# Patient Record
Sex: Male | Born: 1945 | Race: Asian | Hispanic: No | Smoking: Former smoker
Health system: Southern US, Community
[De-identification: ages and names within clinical notes are randomized; demographics above are authoritative.]

## PROBLEM LIST (undated history)

## (undated) DIAGNOSIS — I10 Essential (primary) hypertension: Secondary | ICD-10-CM

---

## 2009-11-26 ENCOUNTER — Encounter (INDEPENDENT_AMBULATORY_CARE_PROVIDER_SITE_OTHER): Payer: Self-pay | Admitting: Internal Medicine

## 2009-11-26 ENCOUNTER — Ambulatory Visit: Payer: Self-pay | Admitting: Family Medicine

## 2009-11-26 LAB — CONVERTED CEMR LAB
Alkaline Phosphatase: 87 units/L (ref 39–117)
Basophils Absolute: 0 10*3/uL (ref 0.0–0.1)
Creatinine, Ser: 0.79 mg/dL (ref 0.40–1.50)
Eosinophils Absolute: 0.1 10*3/uL (ref 0.0–0.7)
Eosinophils Relative: 2 % (ref 0–5)
Glucose, Bld: 98 mg/dL (ref 70–99)
Hemoglobin: 16.1 g/dL (ref 13.0–17.0)
Lymphs Abs: 2 10*3/uL (ref 0.7–4.0)
MCV: 88.2 fL (ref 78.0–100.0)
Monocytes Absolute: 0.3 10*3/uL (ref 0.1–1.0)
Platelets: 220 10*3/uL (ref 150–400)
RBC: 5.35 M/uL (ref 4.22–5.81)
RDW: 13.8 % (ref 11.5–15.5)
Sodium: 134 meq/L — ABNORMAL LOW (ref 135–145)
TSH: 1.469 microintl units/mL (ref 0.350–4.500)
Total Bilirubin: 0.7 mg/dL (ref 0.3–1.2)
Total Protein: 8.3 g/dL (ref 6.0–8.3)
WBC: 4.9 10*3/uL (ref 4.0–10.5)

## 2009-11-27 ENCOUNTER — Encounter (INDEPENDENT_AMBULATORY_CARE_PROVIDER_SITE_OTHER): Payer: Self-pay | Admitting: Internal Medicine

## 2009-11-27 LAB — CONVERTED CEMR LAB: Microalb, Ur: 1.81 mg/dL (ref 0.00–1.89)

## 2009-12-02 ENCOUNTER — Ambulatory Visit: Payer: Self-pay | Admitting: Internal Medicine

## 2011-10-16 ENCOUNTER — Emergency Department (HOSPITAL_COMMUNITY)
Admission: EM | Admit: 2011-10-16 | Discharge: 2011-10-17 | Disposition: A | Discharge status: Home / Self Care | Payer: Medicare Other | Attending: Emergency Medicine | Admitting: Emergency Medicine

## 2011-10-16 ENCOUNTER — Emergency Department (HOSPITAL_COMMUNITY): Payer: Medicare Other

## 2011-10-16 ENCOUNTER — Encounter (HOSPITAL_COMMUNITY): Payer: Self-pay | Admitting: Nurse Practitioner

## 2011-10-16 DIAGNOSIS — J189 Pneumonia, unspecified organism: Secondary | ICD-10-CM | POA: Insufficient documentation

## 2011-10-16 DIAGNOSIS — I10 Essential (primary) hypertension: Secondary | ICD-10-CM | POA: Insufficient documentation

## 2011-10-16 DIAGNOSIS — E119 Type 2 diabetes mellitus without complications: Secondary | ICD-10-CM | POA: Insufficient documentation

## 2011-10-16 HISTORY — DX: Essential (primary) hypertension: I10

## 2011-10-16 LAB — CBC
MCV: 85.3 fL (ref 78.0–100.0)
Platelets: 183 10*3/uL (ref 150–400)
RBC: 4.82 MIL/uL (ref 4.22–5.81)
RDW: 13 % (ref 11.5–15.5)
WBC: 5.9 10*3/uL (ref 4.0–10.5)

## 2011-10-16 LAB — BASIC METABOLIC PANEL
CO2: 24 mEq/L (ref 19–32)
Chloride: 95 mEq/L — ABNORMAL LOW (ref 96–112)
GFR calc Af Amer: >90 mL/min (ref >90)
Potassium: 4.1 mEq/L (ref 3.5–5.1)
Sodium: 132 mEq/L — ABNORMAL LOW (ref 135–145)

## 2011-10-16 LAB — POCT I-STAT TROPONIN I

## 2011-10-16 MED ORDER — AZITHROMYCIN 250 MG PO TABS: 250.0000 mg | ORAL_TABLET | Freq: Every day | ORAL | Status: AC

## 2011-10-16 MED ORDER — AZITHROMYCIN 250 MG PO TABS
ORAL_TABLET | ORAL | Status: AC
  Filled 2011-10-16: qty 2

## 2011-10-16 MED ORDER — AZITHROMYCIN 250 MG PO TABS
500.0000 mg | ORAL_TABLET | Freq: Once | ORAL | Status: AC
  Administered 2011-10-16: 500 mg via ORAL

## 2011-10-16 MED ORDER — HYDROCOD POLST-CHLORPHEN POLST 10-8 MG/5ML PO LQCR: 5.0000 mL | Freq: Two times a day (BID) | ORAL | Status: DC | PRN

## 2011-10-16 MED ORDER — HYDROCOD POLST-CHLORPHEN POLST 10-8 MG/5ML PO LQCR
5.0000 mL | Freq: Once | ORAL | Status: AC
  Administered 2011-10-16: 5 mL via ORAL

## 2011-10-16 MED ORDER — HYDROCOD POLST-CHLORPHEN POLST 10-8 MG/5ML PO LQCR
ORAL | Status: AC
  Filled 2011-10-16: qty 10

## 2011-10-16 NOTE — ED Provider Notes (Signed)
History     CSN: [id number]  Arrival date & time 10/16/11  1851   First MD Initiated Contact with Patient 10/16/11 2305      Chief Complaint  Patient presents with  . Shortness of Breath    (Consider location/radiation/quality/duration/timing/severity/associated sxs/prior treatment) HPI 66 year old male presents to the emergency apartment complaining of shortness of breath, and a sense that his heart is beating slow. Family member is translating as patient does not speak [place]. Patient has had cough for 2 weeks. Today at 4:00 he began to have a sensation that his heart was beating slow, and pressure as a band across and around his chest. No fevers no nausea no vomiting no diaphoresis. No known sick contacts. Patient is eating and drinking well. Patient denies chest pain. Patient has history as diabetic with hypertension. Sees [facility]. No lightheadedness, syncope. No rashes Past Medical History  Diagnosis Date  . Diabetes mellitus   . Hypertension     History reviewed. No pertinent past surgical history.  History reviewed. No pertinent family history.  History  Substance Use Topics  . Smoking status: Former Smoker    Quit date: 10/15/1997  . Smokeless tobacco: Not on file  . Alcohol Use: No      Review of Systems  Unable to perform ROS: Other   patient does not speak English  Allergies  Review of patient's allergies indicates no known allergies.  Home Medications   Current Outpatient Rx  Name Route Sig Dispense Refill  . GUAIFENESIN ER 600 MG PO TB12 Oral Take 600 mg by mouth 2 (two) times daily.    Marland Kitchen METFORMIN HCL 1000 MG PO TABS Oral Take 1,000 mg by mouth 2 (two) times daily with a meal.    . OMEPRAZOLE 20 MG PO CPDR Oral Take 20 mg by mouth daily.      BP 148/86  Pulse 66  Temp 97.6 F (36.4 C) (Oral)  Resp 16  Ht 5\' 8"  (1.727 m)  Wt 147 lb (66.679 kg)  BMI 22.35 kg/m2  SpO2 99%  Physical Exam  Nursing note and vitals reviewed. Constitutional: He  is oriented to person, place, and time. He appears well-developed and well-nourished.  HENT:  Head: Normocephalic and atraumatic.  Nose: Nose normal.  Mouth/Throat: Oropharynx is clear and moist.  Eyes: Conjunctivae and EOM are normal. Pupils are equal, round, and reactive to light.  Neck: Normal range of motion. Neck supple. No JVD present. No tracheal deviation present. No thyromegaly present.  Cardiovascular: Normal rate, regular rhythm, normal heart sounds and intact distal pulses.  Exam reveals no gallop and no friction rub.   No murmur heard. Pulmonary/Chest: Effort normal and breath sounds normal. No stridor. No respiratory distress. He has no wheezes. He has no rales. He exhibits no tenderness.  Abdominal: Soft. Bowel sounds are normal. He exhibits no distension and no mass. There is no tenderness. There is no rebound and no guarding.  Musculoskeletal: Normal range of motion. He exhibits no edema and no tenderness.  Lymphadenopathy:    He has no cervical adenopathy.  Neurological: He is alert and oriented to person, place, and time. He exhibits normal muscle tone. Coordination normal.  Skin: Skin is dry. No rash noted. No erythema. No pallor.    ED Course  Procedures (including critical care time)  Labs Reviewed  BASIC METABOLIC PANEL - Abnormal; Notable for the following:    Sodium 132 (*)     Chloride 95 (*)     Glucose, Bld  112 (*)     All other components within normal limits  CBC  POCT I-STAT TROPONIN I   Dg Chest 2 View  10/16/2011  *RADIOLOGY REPORT*  Clinical Data: Short of breath.  Heart is beating flow.  Sensation of lung blockage.  Cough for 2 weeks.  CHEST - 2 VIEW  Comparison: None.  Findings: Patchy density is present in the lingula at the cardiac apex.  This is present on the lateral view as well, suspicious for small focus of pneumonia.  The right lung appears clear. Cardiopericardial silhouette appears within normal limits. There is no effusion.  No plain film  evidence of adenopathy.  IMPRESSION: Faint patchy density along the cardiac apex in the lingula suspicious for pneumonia.  Original Report Authenticated By: Andreas Newport, M.D.    Date: 10/16/2011  Rate:76  Rhythm: normal sinus rhythm  QRS Axis: normal  Intervals: normal  ST/T Wave abnormalities: normal  Conduction Disutrbances:none  Narrative Interpretation:   Old EKG Reviewed: none available   1. Community acquired pneumonia       MDM  66 year old male with shortness of breath and cough for 2 weeks and a sensation of bradycardia. No bradycardia noted here in the emergency department. Patient's chest x-ray shows left lingular pneumonia. Will treat for same and have patient followup with his doctor.        Olivia Mackie, MD 10/16/11 [phone]

## 2011-10-16 NOTE — ED Notes (Signed)
Pt reports feeling SOB and "like his heart is beating slow" since this afternoon. States "it feels like something is blocking his lungs." pt denies any cardiac history. Pt speaks vietnamese. Pt is A&Ox4, resp e/u

## 2011-10-17 NOTE — ED Notes (Signed)
The patient is AOx4 and comfortable with his discharge instructions.  His ride home is present. 

## 2012-10-11 ENCOUNTER — Other Ambulatory Visit: Payer: Self-pay | Admitting: *Deleted

## 2012-11-21 ENCOUNTER — Other Ambulatory Visit: Payer: Self-pay | Admitting: *Deleted

## 2013-10-11 ENCOUNTER — Emergency Department (HOSPITAL_COMMUNITY): Payer: Medicare Other

## 2013-10-11 ENCOUNTER — Emergency Department (HOSPITAL_COMMUNITY)
Admission: EM | Admit: 2013-10-11 | Discharge: 2013-10-12 | Disposition: A | Discharge status: Home / Self Care | Payer: Medicare Other | Attending: Emergency Medicine | Admitting: Emergency Medicine

## 2013-10-11 ENCOUNTER — Encounter (HOSPITAL_COMMUNITY): Payer: Self-pay | Admitting: Emergency Medicine

## 2013-10-11 DIAGNOSIS — R062 Wheezing: Secondary | ICD-10-CM | POA: Insufficient documentation

## 2013-10-11 DIAGNOSIS — Z79899 Other long term (current) drug therapy: Secondary | ICD-10-CM | POA: Insufficient documentation

## 2013-10-11 DIAGNOSIS — I10 Essential (primary) hypertension: Secondary | ICD-10-CM | POA: Insufficient documentation

## 2013-10-11 DIAGNOSIS — E119 Type 2 diabetes mellitus without complications: Secondary | ICD-10-CM | POA: Insufficient documentation

## 2013-10-11 DIAGNOSIS — Z87891 Personal history of nicotine dependence: Secondary | ICD-10-CM | POA: Insufficient documentation

## 2013-10-11 DIAGNOSIS — J40 Bronchitis, not specified as acute or chronic: Secondary | ICD-10-CM

## 2013-10-11 LAB — BASIC METABOLIC PANEL
ANION GAP: 13 (ref 5–15)
BUN: 15 mg/dL (ref 6–23)
CALCIUM: 9.3 mg/dL (ref 8.4–10.5)
CO2: 27 mEq/L (ref 19–32)
Chloride: 93 mEq/L — ABNORMAL LOW (ref 96–112)
Creatinine, Ser: 0.79 mg/dL (ref 0.50–1.35)
Glucose, Bld: 100 mg/dL — ABNORMAL HIGH (ref 70–99)
Potassium: 4.2 mEq/L (ref 3.7–5.3)
SODIUM: 133 meq/L — AB (ref 137–147)

## 2013-10-11 LAB — CBC
HCT: 37 % — ABNORMAL LOW (ref 39.0–52.0)
Hemoglobin: 12.4 g/dL — ABNORMAL LOW (ref 13.0–17.0)
MCH: 29.2 pg (ref 26.0–34.0)
MCHC: 33.5 g/dL (ref 30.0–36.0)
MCV: 87.1 fL (ref 78.0–100.0)
PLATELETS: 168 10*3/uL (ref 150–400)
RBC: 4.25 MIL/uL (ref 4.22–5.81)
RDW: 13.7 % (ref 11.5–15.5)
WBC: 4.5 10*3/uL (ref 4.0–10.5)

## 2013-10-11 LAB — I-STAT TROPONIN, ED: TROPONIN I, POC: 0 ng/mL (ref 0.00–0.08)

## 2013-10-11 MED ORDER — ALBUTEROL (5 MG/ML) CONTINUOUS INHALATION SOLN
10.0000 mg/h | INHALATION_SOLUTION | Freq: Once | RESPIRATORY_TRACT | Status: AC
  Administered 2013-10-11: 10 mg/h via RESPIRATORY_TRACT
  Filled 2013-10-11: qty 20

## 2013-10-11 MED ORDER — AZITHROMYCIN 250 MG PO TABS
500.0000 mg | ORAL_TABLET | Freq: Every day | ORAL | Status: DC
  Administered 2013-10-11: 500 mg via ORAL
  Filled 2013-10-11: qty 2

## 2013-10-11 MED ORDER — PREDNISONE 20 MG PO TABS
60.0000 mg | ORAL_TABLET | Freq: Once | ORAL | Status: AC
  Administered 2013-10-11: 60 mg via ORAL
  Filled 2013-10-11: qty 3

## 2013-10-11 MED ORDER — IPRATROPIUM BROMIDE 0.02 % IN SOLN
0.5000 mg | Freq: Once | RESPIRATORY_TRACT | Status: AC
  Administered 2013-10-11: 0.5 mg via RESPIRATORY_TRACT
  Filled 2013-10-11: qty 2.5

## 2013-10-11 NOTE — ED Notes (Signed)
Daughter states that pt has been having a cough and cp for one week.  Daughter states that her father has a history of pneumonia.

## 2013-10-11 NOTE — ED Notes (Signed)
MD at bedside - Dr. Allen 

## 2013-10-11 NOTE — ED Provider Notes (Signed)
CSN: [id number]     Arrival date & time 10/11/13  2040 History   First MD Initiated Contact with Patient 10/11/13 2305     Chief Complaint  Patient presents with  . Chest Pain  . Cough     (Consider location/radiation/quality/duration/timing/severity/associated sxs/prior Treatment) Patient is a 68 y.o. male presenting with chest pain and cough. The history is provided by the patient and a relative.  Chest Pain Associated symptoms: cough   Cough Associated symptoms: chest pain    patient here complaining of cough and congestion x1 week. Describes sharp bilateral chest pain that only occurs when he coughs. No associated fever or chills. No anginal type chest pain. Has used over-the-counter medications without relief. History of similar symptoms associated pneumonia. Denies any orthopnea or dyspnea on exertion. No radiation of his symptoms his arms and neck. No associated diaphoresis. No recent weight loss or travel history. Denies any hemoptysis  Past Medical History  Diagnosis Date  . Diabetes mellitus   . Hypertension    History reviewed. No pertinent past surgical history. No family history on file. History  Substance Use Topics  . Smoking status: Former Smoker    Quit date: 10/15/1997  . Smokeless tobacco: Not on file  . Alcohol Use: No    Review of Systems  Respiratory: Positive for cough.   Cardiovascular: Positive for chest pain.  All other systems reviewed and are negative.     Allergies  Review of patient's allergies indicates no known allergies.  Home Medications   Prior to Admission medications   Medication Sig Start Date End Date Taking? Authorizing Provider  guaiFENesin (MUCINEX) 600 MG 12 hr tablet Take 600 mg by mouth 2 (two) times daily.   Yes Historical Provider, MD  lisinopril (PRINIVIL,ZESTRIL) 2.5 MG tablet Take 2.5 mg by mouth daily.   Yes Historical Provider, MD  loratadine (CLARITIN) 10 MG tablet Take 10 mg by mouth daily.   Yes Historical  Provider, MD  metFORMIN (GLUCOPHAGE) 1000 MG tablet Take 1,000 mg by mouth 2 (two) times daily with a meal.   Yes Historical Provider, MD  omeprazole (PRILOSEC) 20 MG capsule Take 20 mg by mouth daily.   Yes Historical Provider, MD   BP 117/76  Pulse 62  Temp(Src) 98.2 F (36.8 C) (Oral)  Resp 14  Wt 154 lb 8 oz (70.081 kg)  SpO2 96% Physical Exam  Nursing note and vitals reviewed. Constitutional: He is oriented to person, place, and time. He appears well-developed and well-nourished.  Non-toxic appearance. No distress.  HENT:  Head: Normocephalic and atraumatic.  Eyes: Conjunctivae, EOM and lids are normal. Pupils are equal, round, and reactive to light.  Neck: Normal range of motion. Neck supple. No tracheal deviation present. No mass present.  Cardiovascular: Normal rate, regular rhythm and normal heart sounds.  Exam reveals no gallop.   No murmur heard. Pulmonary/Chest: Effort normal. No stridor. No respiratory distress. He has decreased breath sounds. He has wheezes. He has no rhonchi. He has no rales.  Abdominal: Soft. Normal appearance and bowel sounds are normal. He exhibits no distension. There is no tenderness. There is no rebound and no CVA tenderness.  Musculoskeletal: Normal range of motion. He exhibits no edema and no tenderness.  Neurological: He is alert and oriented to person, place, and time. He has normal strength. No cranial nerve deficit or sensory deficit. GCS eye subscore is 4. GCS verbal subscore is 5. GCS motor subscore is 6.  Skin: Skin is warm and dry.  No abrasion and no rash noted.  Psychiatric: He has a normal mood and affect. His speech is normal and behavior is normal.    ED Course  Procedures (including critical care time) Labs Review Labs Reviewed  CBC - Abnormal; Notable for the following:    Hemoglobin 12.4 (*)    HCT 37.0 (*)    All other components within normal limits  BASIC METABOLIC PANEL - Abnormal; Notable for the following:    Sodium 133  (*)    Chloride 93 (*)    Glucose, Bld 100 (*)    All other components within normal limits  I-STAT TROPOININ, ED    Imaging Review Dg Chest 2 View  10/11/2013   CLINICAL DATA:  Chest pain, shortness of breath, worsening cough  EXAM: CHEST  2 VIEW  COMPARISON:  10/16/2011  FINDINGS: Lungs are essentially clear. No focal consolidation. No pleural effusion or pneumothorax.  The heart is normal in size.  Mild degenerative changes of the visualized thoracolumbar spine.  IMPRESSION: No evidence of acute cardiopulmonary disease.   Electronically Signed   By: Charline BillsSriyesh  Krishnan M.D.   On: 10/11/2013 21:32     EKG Interpretation   Date/Time:  Friday October 11 2013 20:52:09 EDT Ventricular Rate:  68 PR Interval:  200 QRS Duration: 68 QT Interval:  394 QTC Calculation: 418 R Axis:   2 Text Interpretation:  Normal sinus rhythm Low voltage QRS Borderline ECG  No significant change since last tracing Confirmed by Dariya Gainer  MD, Mya Suell  ([id number]) on 10/11/2013 11:13:26 PM      MDM   Final diagnoses:  None   Patient given albuterol along with & Medrol here for suspected bronchitis. Wheezing has improved. Given [employer] Perles. Symptoms are worse at night and patient to be placed on Zithromax and treated for bronchitis.     Toy BakerAnthony T Deaja Rizo, MD 10/12/13 [phone]

## 2013-10-11 NOTE — ED Notes (Signed)
PT states he has had chronic dry cough for a few years (up to 5 yrs). PT is on ACEi and may have developed the cough around the same time but can't remember. The chronic cough is usually dry and tickles the throat. Has been to allergist to investigate cough b/c they thought it was allergies, but had negative findings r/t allergies. Daughter acting as Nurse, learning disabilitytranslator in room. PT has increased cough sx over part 1-2 weeks with some productive yellow sputum and he has CP now with cough

## 2013-10-12 MED ORDER — PREDNISONE 10 MG PO TABS: 20.0000 mg | ORAL_TABLET | Freq: Every day | ORAL | Status: AC

## 2013-10-12 MED ORDER — AZITHROMYCIN 250 MG PO TABS: ORAL_TABLET | ORAL | Status: AC

## 2013-10-12 MED ORDER — ALBUTEROL SULFATE HFA 108 (90 BASE) MCG/ACT IN AERS
2.0000 | INHALATION_SPRAY | RESPIRATORY_TRACT | Status: DC
  Administered 2013-10-12: 2 via RESPIRATORY_TRACT
  Filled 2013-10-12: qty 6.7

## 2013-10-12 MED ORDER — BENZONATATE 100 MG PO CAPS
200.0000 mg | ORAL_CAPSULE | Freq: Once | ORAL | Status: AC
  Administered 2013-10-12: 200 mg via ORAL
  Filled 2013-10-12: qty 2

## 2013-10-12 MED ORDER — BENZONATATE 100 MG PO CAPS: 100.0000 mg | ORAL_CAPSULE | Freq: Three times a day (TID) | ORAL | Status: AC

## 2013-10-12 NOTE — Discharge Instructions (Signed)
Vim ph? qu?n (Bronchitis) Vim ph? qu?n l vim ???ng th? t? kh qu?n vo ph?i (ph? qu?n). Tnh tr?ng vim th??ng gy s?n sinh d?ch nh?y v d?n ??n ho. N?u vim tr? nn n?ng h?n, n c th? gy ra kh th?. NGUYN NHN.  Vim ph? qu?n c th? Strohmeier:   Nhi?m vi rt  Vi khu?n.  Khi thu?c l.  D? ?ng nguyn, ch?t gy  nhi?m v cc ch?t gy kch ?ng khc. D?U HI?U V TRI?U CH?NG  Tri?u ch?ng ph? bi?n nh?t c?a vim ph? qu?n l ho th??ng xuyn ra d?ch nh?y. Cc tri?u ch?ng khc bao g?m:  S?t.  ?au nh?c ton thn.  T?c ng?c.  ?n l?nh.  Kh th?.  ?au h?ng. CH?N ?ON  Vim ph? qu?n th??ng ???c ch?n ?on thng qua b?nh s? v khm th?c th?. Nh?ng ki?m tra nh? ch?p X-quang ng?c ??i khi ???c th?c hi?n ?? lo?i tr? cc b?nh l khc.  ?I?U TR?  Qu v? c th? c?n trnh ti?p xc v?i t?t c? nh?ng g gy b?nh (v d? nh? khi thu?c). ?i khi c?n ph?i dng thu?c. Nh?ng thu?c ny c th? bao g?m:  Khng sinh. Thu?c khng sinh c th? ???c k ??n n?u tnh tr?ng b?nh Pistilli vi khu?n gy ra.  Thu?c gi?m ho. Nh?ng thu?c ny c th? ???c k ??n ?? gi?m tri?u ch?ng ho.  Thu?c d?ng ht. Nh?ng thu?c ny c th? ???c k ??n ?? gip khai thng ???ng th? v lm cho qu v? d? th? h?n.  Cc lo?i thu?c steroid. Nh?ng lo?i thu?c ny c th? ???c k ??n cho nh?ng ng??i b? vim ph? qu?n ti pht (m?n tnh) H??NG D?N CH?M SC T?I NH  Ngh? ng?i th?t nhi?u.  U?ng ?? n??c ?? n??c ti?u lun trong ho?c mu vng nh?t (tr? khi qu v? c b?nh l c?n ph?i h?n ch? u?ng n??c). T?ng l??ng n??c c th? gip lm l?ng ??m v s? ng?n ng?a m?t n??c.  Ch? s? d?ng thu?c khng c?n k ??n ho?c thu?c c?n k ??n theo ch? d?n c?a chuyn gia ch?m sc s?c kh?e.  Ch? dng thu?c khng sinh theo ch? d?n. B?o ??m vi?c qu v? dng h?t thu?c ngay c? khi qu v? b?t ??u c?m th?y ?? h?n.  Trnh khi thu?c th? ??ng, cc ch?t gy kch ?ng, v lu?ng khi m?nh. Nh?ng th? ny lm vim ph? qu?n n?ng h?n. N?u qu v? ht thu?c, hy b? ht thu?c. Cn nh?c s? d?ng  k?o cao su c nicotine ho?c mi?ng dn trn da ?? gip ki?m sot cc tri?u ch?ng sau cai. B? ht thu?c s? gip ph?i bnh ph?c nhanh h?n.  ?? m?t chi?c my t?o s??ng mt trong phng ng? vo ban ?m ?? lm ?m khng kh. Vi?c ny gip lm l?ng d?ch nh?y. Thay n??c trong my t?o s??ng hng ngy. Qu v? c?ng c th? cho n??c nng khi t?m vi hoa sen v ng?i trong phng t?m ?ng kn c?a trong 5-10 pht.  G?p chuyn gia ch?m sc s?c kh?e ?? khm l?i theo ch? d?n.  R?a tay th??ng xuyn ?? trnh m?c l?i b?nh vim ph? qu?n ho?c ly b?nh cho ng??i khc. ?I KHM N?U: Cc tri?u ch?ng c?a qu v? khng gi?m b?t sau 1 tu?n ?i?u tr?.  NGAY L?P T?C ?I KHM N?U:  C?n s?t c?a qu v? t?ng ln.  Qu v? b? ?n l?nh.  Qu v? b? ?au ng?c.  Qu v? b? kh   th? h?n.  Qu v? c ??m l?n mu.  Qu v? b? ng?t.  Qu v? b? chong vng.  Qu v? b? ?au ??u n?ng.  Qu v? nn nhi?u l?n. ??M B?O QU V?:   Hi?u r cc h??ng d?n ny.  S? theo di tnh tr?ng c?a mnh.  S? yu c?u tr? gip ngay l?p t?c n?u qu v? c?m th?y khng kh?e ho?c th?y tr?m tr?ng h?n. Document Released: 03/28/2005 Document Revised: 01/16/2013 [facility] Patient Information 2015 [employer], [place]. This information is not intended to replace advice given to you by your health care provider. Make sure you discuss any questions you have with your health care provider.

## 2015-11-04 ENCOUNTER — Other Ambulatory Visit: Payer: Self-pay | Admitting: Internal Medicine

## 2015-11-04 DIAGNOSIS — G4489 Other headache syndrome: Secondary | ICD-10-CM

## 2015-11-11 ENCOUNTER — Ambulatory Visit
Admission: RE | Admit: 2015-11-11 | Discharge: 2015-11-11 | Disposition: A | Discharge status: Home / Self Care | Payer: Medicaid Other | Source: Ambulatory Visit | Attending: Internal Medicine | Admitting: Internal Medicine

## 2015-11-11 ENCOUNTER — Other Ambulatory Visit: Payer: Medicaid Other

## 2015-11-11 DIAGNOSIS — G4489 Other headache syndrome: Secondary | ICD-10-CM

## 2016-02-29 IMAGING — CR DG CHEST 2V
2 series · 2 of 2 positions shown · non-contrast
Comparison: 10/16/2011

CLINICAL DATA: Chest pain, shortness of breath, worsening cough

EXAM:
CHEST  2 VIEW

[w chest pa]
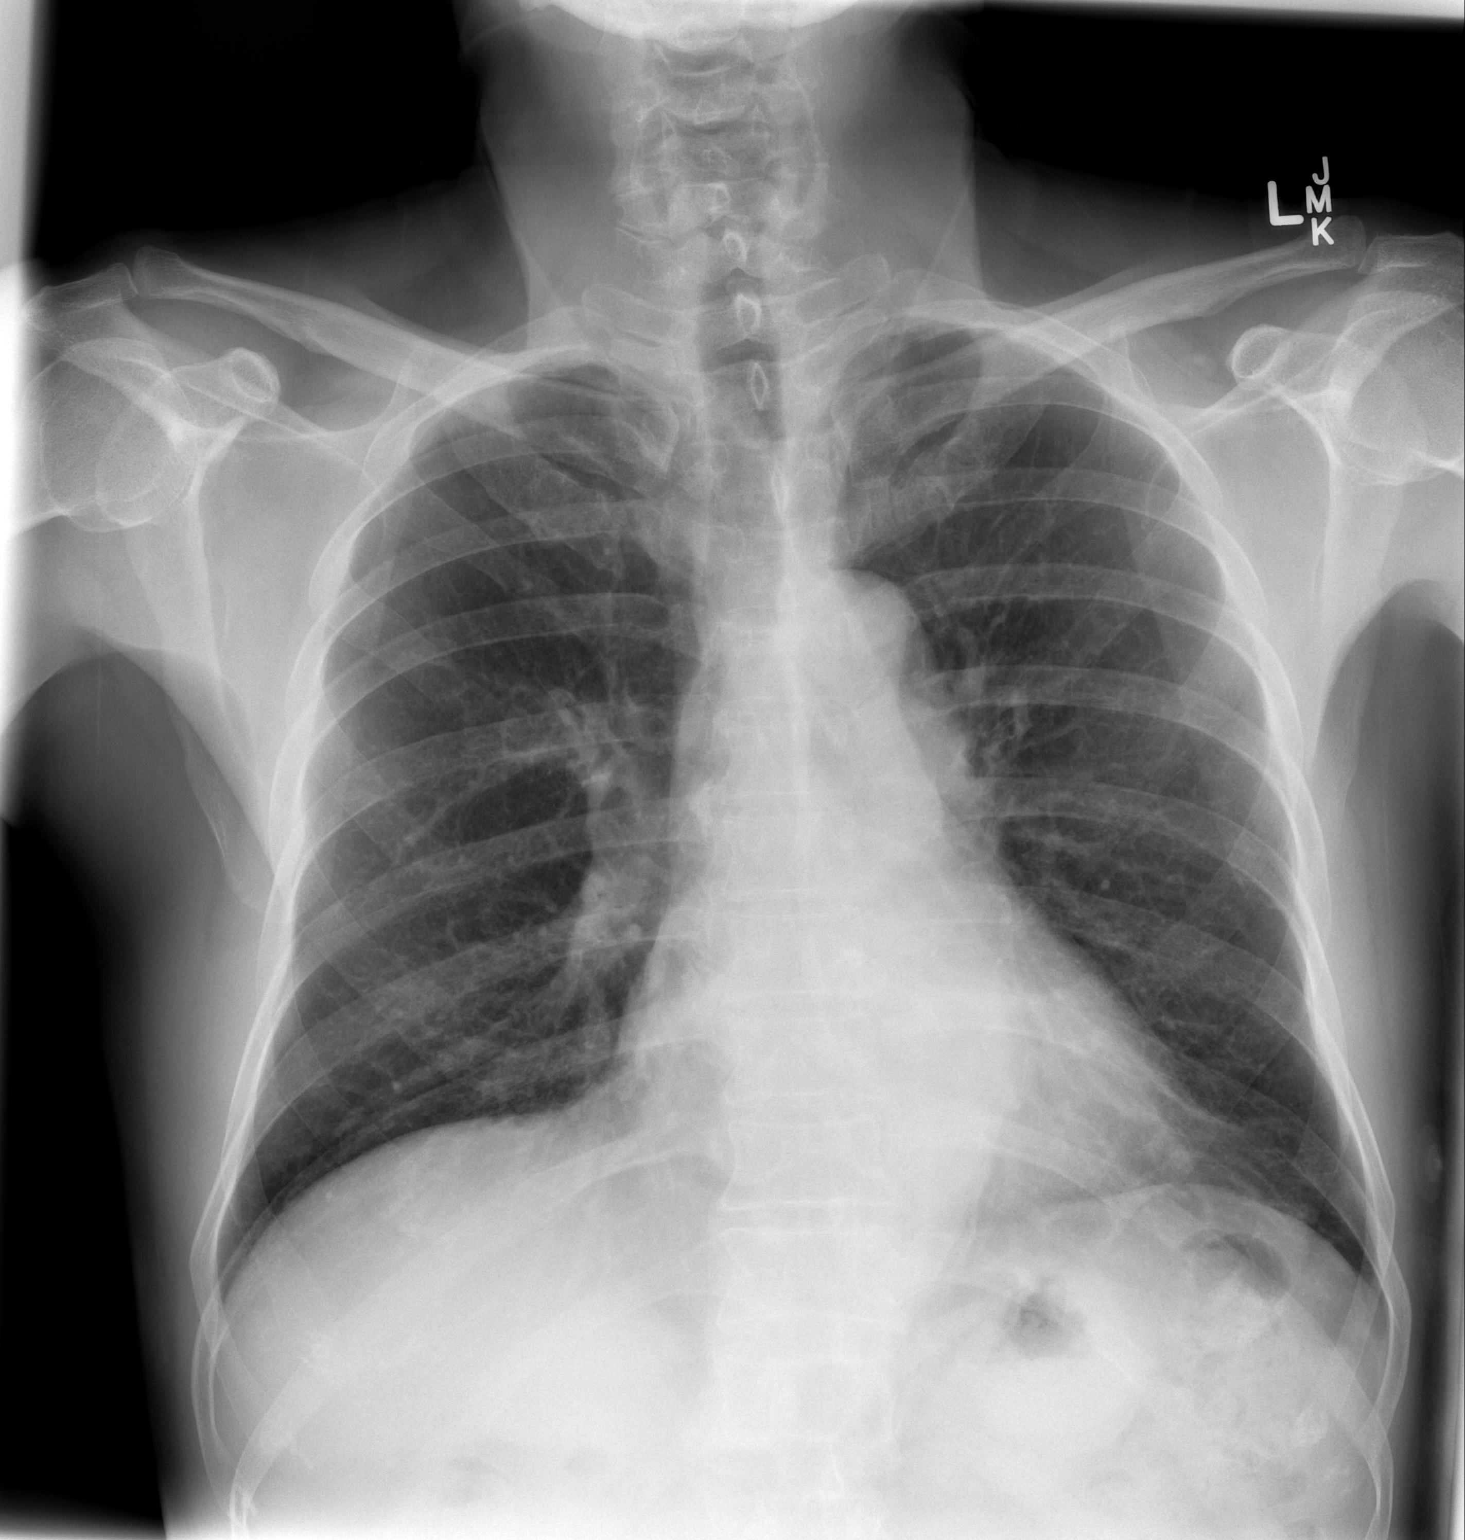

[w chest lat]
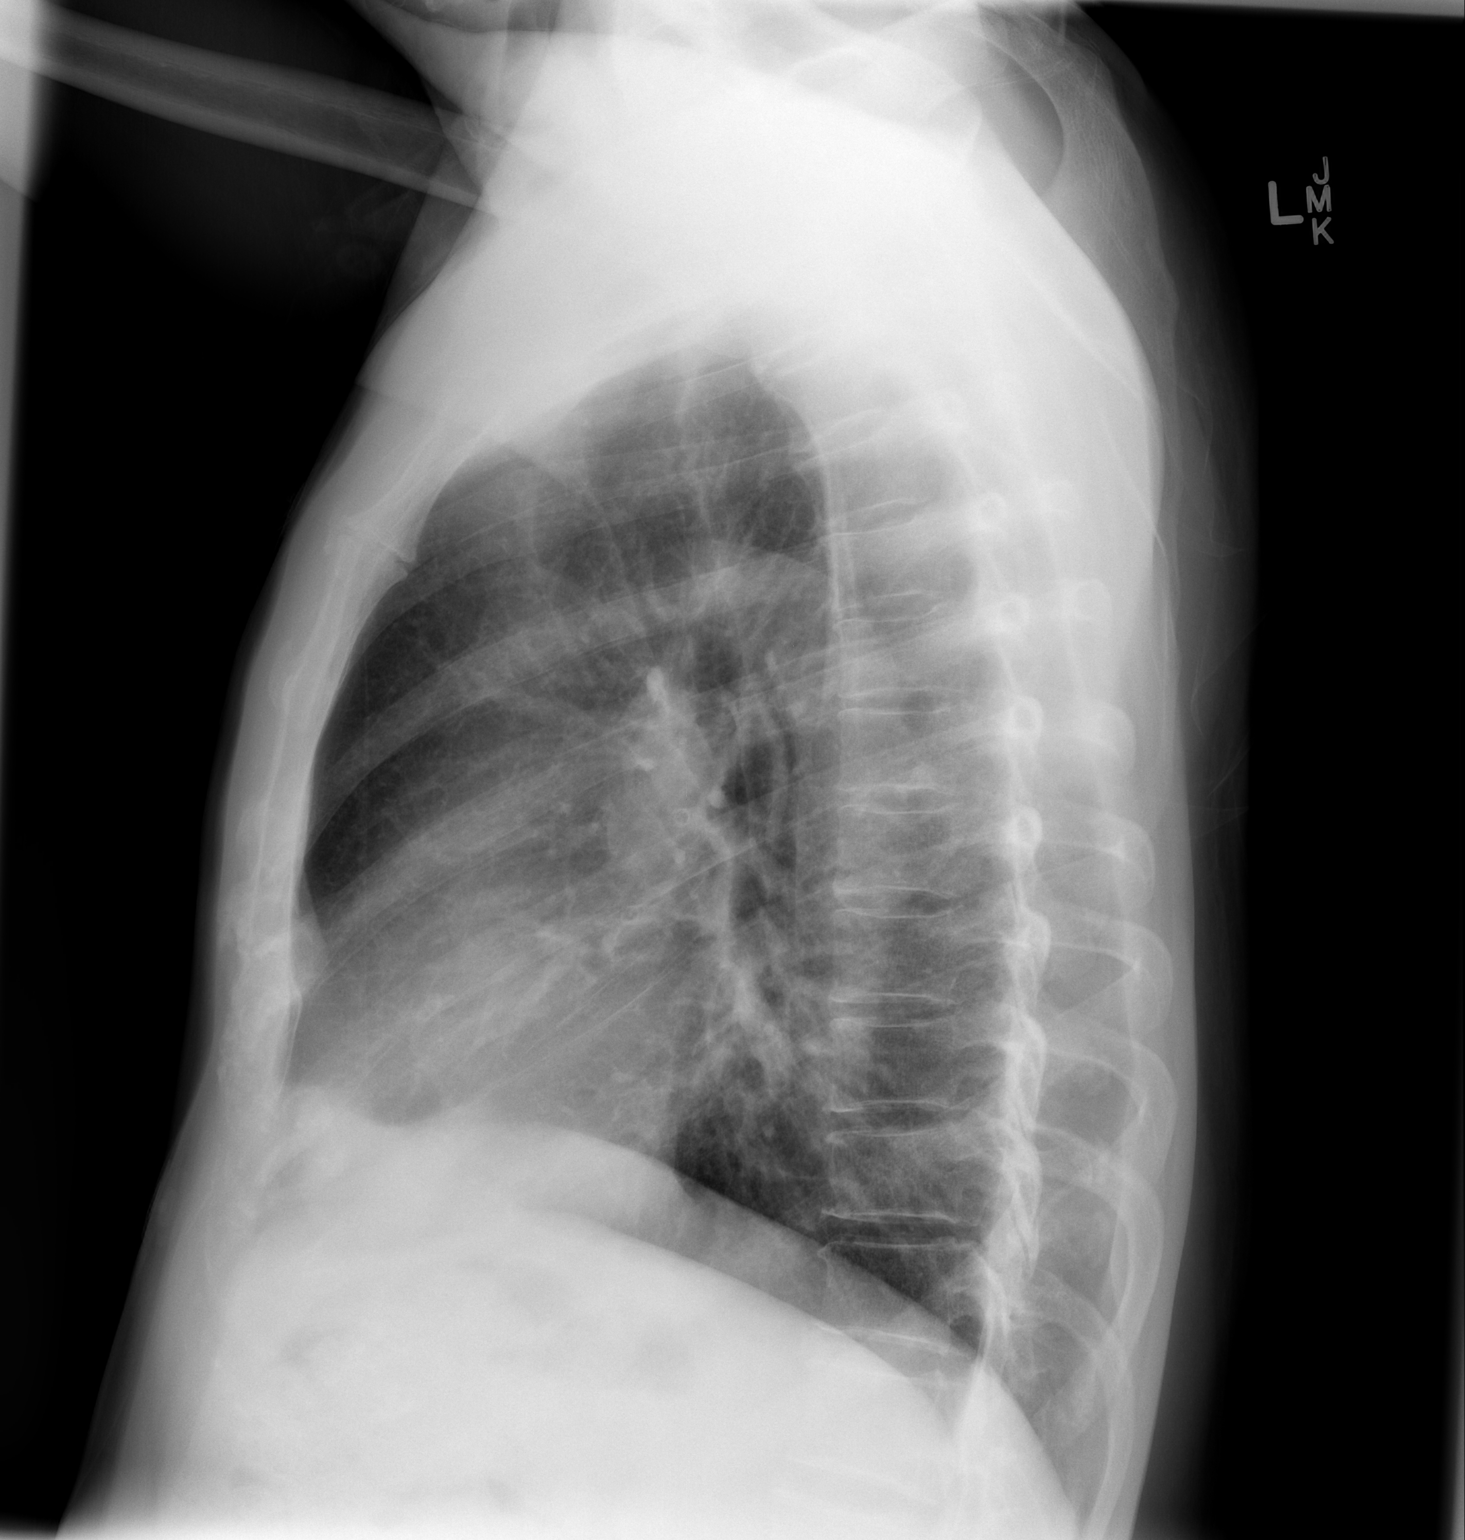

[2 of 2 positions shown; findings below may reference images not displayed]

FINDINGS: Lungs are essentially clear. No focal consolidation. No pleural
effusion or pneumothorax.

The heart is normal in size.

Mild degenerative changes of the visualized thoracolumbar spine.
IMPRESSION: No evidence of acute cardiopulmonary disease.

## 2022-04-22 ENCOUNTER — Ambulatory Visit: Payer: Self-pay | Admitting: Family

## 2022-04-22 ENCOUNTER — Encounter: Payer: Medicare (Managed Care) | Admitting: Family
